# Patient Record
Sex: Male | Born: 1970 | Race: White | Hispanic: No | Marital: Single | Smoking: Never smoker
Health system: Southern US, Community
[De-identification: ages and names within clinical notes are randomized; demographics above are authoritative.]

## PROBLEM LIST (undated history)

## (undated) DIAGNOSIS — E119 Type 2 diabetes mellitus without complications: Secondary | ICD-10-CM

## (undated) HISTORY — PX: BACK SURGERY: SHX140

## (undated) HISTORY — PX: ROTATOR CUFF REPAIR: SHX139

---

## 2018-03-17 ENCOUNTER — Emergency Department (HOSPITAL_COMMUNITY): Payer: Commercial Managed Care - PPO

## 2018-03-17 ENCOUNTER — Other Ambulatory Visit: Payer: Self-pay

## 2018-03-17 ENCOUNTER — Encounter (HOSPITAL_COMMUNITY): Payer: Self-pay | Admitting: Emergency Medicine

## 2018-03-17 ENCOUNTER — Emergency Department (HOSPITAL_COMMUNITY)
Admission: EM | Admit: 2018-03-17 | Discharge: 2018-03-17 | Disposition: A | Discharge status: Home / Self Care | Payer: Commercial Managed Care - PPO | Attending: Emergency Medicine | Admitting: Emergency Medicine

## 2018-03-17 DIAGNOSIS — R109 Unspecified abdominal pain: Secondary | ICD-10-CM | POA: Insufficient documentation

## 2018-03-17 DIAGNOSIS — E119 Type 2 diabetes mellitus without complications: Secondary | ICD-10-CM | POA: Insufficient documentation

## 2018-03-17 DIAGNOSIS — R1110 Vomiting, unspecified: Secondary | ICD-10-CM

## 2018-03-17 DIAGNOSIS — K5900 Constipation, unspecified: Secondary | ICD-10-CM | POA: Diagnosis present

## 2018-03-17 HISTORY — DX: Type 2 diabetes mellitus without complications: E11.9

## 2018-03-17 LAB — COMPREHENSIVE METABOLIC PANEL
ALT: 25 U/L (ref 0–44)
ANION GAP: 25 — AB (ref 5–15)
AST: 20 U/L (ref 15–41)
Albumin: 5 g/dL (ref 3.5–5.0)
Alkaline Phosphatase: 86 U/L (ref 38–126)
BUN: 32 mg/dL — ABNORMAL HIGH (ref 6–20)
CO2: 14 mmol/L — ABNORMAL LOW (ref 22–32)
Calcium: 10 mg/dL (ref 8.9–10.3)
Chloride: 95 mmol/L — ABNORMAL LOW (ref 98–111)
Creatinine, Ser: 1.59 mg/dL — ABNORMAL HIGH (ref 0.61–1.24)
GFR calc Af Amer: 59 mL/min — ABNORMAL LOW (ref >60)
GFR calc non Af Amer: 51 mL/min — ABNORMAL LOW (ref >60)
Glucose, Bld: 244 mg/dL — ABNORMAL HIGH (ref 70–99)
Potassium: 5.1 mmol/L (ref 3.5–5.1)
Sodium: 134 mmol/L — ABNORMAL LOW (ref 135–145)
Total Bilirubin: 3.1 mg/dL — ABNORMAL HIGH (ref 0.3–1.2)
Total Protein: 8.7 g/dL — ABNORMAL HIGH (ref 6.5–8.1)

## 2018-03-17 LAB — CBC WITH DIFFERENTIAL/PLATELET
Abs Immature Granulocytes: 0.12 10*3/uL — ABNORMAL HIGH (ref 0.00–0.07)
Basophils Absolute: 0.1 10*3/uL (ref 0.0–0.1)
Basophils Relative: 1 %
EOS ABS: 0 10*3/uL (ref 0.0–0.5)
EOS PCT: 0 %
HCT: 55.7 % — ABNORMAL HIGH (ref 39.0–52.0)
Hemoglobin: 18.4 g/dL — ABNORMAL HIGH (ref 13.0–17.0)
Immature Granulocytes: 1 %
LYMPHS PCT: 10 %
Lymphs Abs: 1.8 10*3/uL (ref 0.7–4.0)
MCH: 28.4 pg (ref 26.0–34.0)
MCHC: 33 g/dL (ref 30.0–36.0)
MCV: 85.8 fL (ref 80.0–100.0)
Monocytes Absolute: 0.5 10*3/uL (ref 0.1–1.0)
Monocytes Relative: 3 %
Neutro Abs: 15 10*3/uL — ABNORMAL HIGH (ref 1.7–7.7)
Neutrophils Relative %: 85 %
Platelets: 321 10*3/uL (ref 150–400)
RBC: 6.49 MIL/uL — ABNORMAL HIGH (ref 4.22–5.81)
RDW: 12.2 % (ref 11.5–15.5)
WBC: 17.6 10*3/uL — ABNORMAL HIGH (ref 4.0–10.5)
nRBC: 0.1 % (ref 0.0–0.2)

## 2018-03-17 LAB — URINALYSIS, ROUTINE W REFLEX MICROSCOPIC
Bacteria, UA: NONE SEEN
Bilirubin Urine: NEGATIVE
Glucose, UA: >=500 mg/dL — AB
Hgb urine dipstick: NEGATIVE
Ketones, ur: 80 mg/dL — AB
Leukocytes,Ua: NEGATIVE
Nitrite: NEGATIVE
Protein, ur: NEGATIVE mg/dL
SPECIFIC GRAVITY, URINE: 1.028 (ref 1.005–1.030)
pH: 5 (ref 5.0–8.0)

## 2018-03-17 MED ORDER — ONDANSETRON 4 MG PO TBDP: ORAL_TABLET | ORAL | 0 refills | Status: AC

## 2018-03-17 MED ORDER — SODIUM CHLORIDE 0.9 % IV BOLUS
1000.0000 mL | Freq: Once | INTRAVENOUS | Status: AC
  Administered 2018-03-17: 1000 mL via INTRAVENOUS

## 2018-03-17 MED ORDER — IOHEXOL 300 MG/ML  SOLN
100.0000 mL | Freq: Once | INTRAMUSCULAR | Status: AC | PRN
  Administered 2018-03-17: 100 mL via INTRAVENOUS

## 2018-03-17 NOTE — ED Provider Notes (Signed)
[facility] EMERGENCY DEPARTMENT Provider Note   CSN: [id number] Arrival date & time: 03/17/18  1552    History   Chief Complaint Chief Complaint  Patient presents with   Constipation    HPI Tyrone Parker is a 48 y.o. male.     Patient has had vomiting yesterday today and feels constipated  The history is provided by the patient. No language interpreter was used.  Constipation  Severity:  Moderate Timing:  Constant Progression:  Worsening Chronicity:  Recurrent Context: dehydration   Stool description:  None produced Relieved by:  Nothing Worsened by:  Nothing Ineffective treatments:  None tried Associated symptoms: abdominal pain   Associated symptoms: no back pain and no diarrhea   Risk factors: no change in medication     Past Medical History:  Diagnosis Date   Diabetes mellitus without complication (HCC)     There are no active problems to display for this patient.   Past Surgical History:  Procedure Laterality Date   BACK SURGERY     ROTATOR CUFF REPAIR Left         Home Medications    Prior to Admission medications   Medication Sig Start Date End Date Taking? Authorizing Provider  ondansetron (ZOFRAN ODT) 4 MG disintegrating tablet 4mg  ODT q4 hours prn nausea/vomit 03/17/18   Bethann Berkshire, MD    Family History Family History  Problem Relation Age of Onset   Cancer Mother    Hypertension Mother    Diabetes Mother    Hypertension Father    Diabetes Father     Social History Social History   Tobacco Use   Smoking status: Never Smoker   Smokeless tobacco: Never Used  Substance Use Topics   Alcohol use: Never    Frequency: Never   Drug use: Never     Allergies   Patient has no known allergies.   Review of Systems Review of Systems  Constitutional: Negative for appetite change and fatigue.  HENT: Negative for congestion, ear discharge and sinus pressure.   Eyes: Negative for discharge.  Respiratory: Negative for  cough.   Cardiovascular: Negative for chest pain.  Gastrointestinal: Positive for abdominal pain and constipation. Negative for diarrhea.  Genitourinary: Negative for frequency and hematuria.  Musculoskeletal: Negative for back pain.  Skin: Negative for rash.  Neurological: Negative for seizures and headaches.  Psychiatric/Behavioral: Negative for hallucinations.     Physical Exam Updated Vital Signs BP (!) 138/94    Pulse (!) 116    Temp 97.7 F (36.5 C) (Oral)    Resp 16    Ht 5\' 11"  (1.803 m)    Wt 102.5 kg    SpO2 100%    BMI 31.52 kg/m   Physical Exam Vitals signs and nursing note reviewed.  Constitutional:      Appearance: He is well-developed.  HENT:     Head: Normocephalic.     Nose: Nose normal.  Eyes:     General: No scleral icterus.    Conjunctiva/sclera: Conjunctivae normal.  Neck:     Musculoskeletal: Neck supple.     Thyroid: No thyromegaly.  Cardiovascular:     Rate and Rhythm: Normal rate and regular rhythm.     Heart sounds: No murmur. No friction rub. No gallop.   Pulmonary:     Breath sounds: No stridor. No wheezing or rales.  Chest:     Chest wall: No tenderness.  Abdominal:     General: There is distension.     Tenderness:  There is no abdominal tenderness. There is no rebound.  Musculoskeletal: Normal range of motion.  Lymphadenopathy:     Cervical: No cervical adenopathy.  Skin:    Findings: No erythema or rash.  Neurological:     Mental Status: He is oriented to person, place, and time.     Motor: No abnormal muscle tone.     Coordination: Coordination normal.  Psychiatric:        Behavior: Behavior normal.      ED Treatments / Results  Labs (all labs ordered are listed, but only abnormal results are displayed) Labs Reviewed  CBC WITH DIFFERENTIAL/PLATELET - Abnormal; Notable for the following components:      Result Value   WBC 17.6 (*)    RBC 6.49 (*)    Hemoglobin 18.4 (*)    HCT 55.7 (*)    Neutro Abs 15.0 (*)    Abs  Immature Granulocytes 0.12 (*)    All other components within normal limits  COMPREHENSIVE METABOLIC PANEL - Abnormal; Notable for the following components:   Sodium 134 (*)    Chloride 95 (*)    CO2 14 (*)    Glucose, Bld 244 (*)    BUN 32 (*)    Creatinine, Ser 1.59 (*)    Total Protein 8.7 (*)    Total Bilirubin 3.1 (*)    GFR calc non Af Amer 51 (*)    GFR calc Af Amer 59 (*)    Anion gap 25 (*)    All other components within normal limits  URINALYSIS, ROUTINE W REFLEX MICROSCOPIC - Abnormal; Notable for the following components:   Glucose, UA >=500 (*)    Ketones, ur 80 (*)    All other components within normal limits    EKG EKG Interpretation  Date/Time:  Sunday March 17 2018 16:25:16 EDT Ventricular Rate:  132 PR Interval:  118 QRS Duration: 80 QT Interval:  292 QTC Calculation: 432 R Axis:   66 Text Interpretation:  Sinus tachycardia Otherwise normal ECG Confirmed by Bethann Berkshire [phone] on 03/17/2018 8:48:43 PM   Radiology Ct Abdomen Pelvis W Contrast  Result Date: 03/17/2018 CLINICAL DATA:  Patient had back surgery on 03/05/2018, patient taking oxycodone. Per patient has been unable to have a "decent BM" since surgery. Patient had enema, suppository, and stool softner yesterday with small results. Patient had another enema today with a little more results. Patient states seen PCP on Wednesday for high glucose levels and was placed on new medications. Patient states after PCP appointment he went to eat and was unable to get half the meal down before having indigestion. Patient states since he has had indigestion with nausea and vomiting. Patient states emesis is green liquid with brown mixed in EXAM: CT ABDOMEN AND PELVIS WITH CONTRAST TECHNIQUE: Multidetector CT imaging of the abdomen and pelvis was performed using the standard protocol following bolus administration of intravenous contrast. CONTRAST:  OMNIPAQUE IOHEXOL 300 MG/ML  SOLN COMPARISON:  Current  abdominal radiographs. FINDINGS: Lower chest: Clear lung bases.  Heart normal in size. Hepatobiliary: Decreased attenuation of the liver consistent with fatty infiltration. Liver size. No mass or focal lesion. Normal gallbladder. No bile duct dilation. Pancreas: Unremarkable. No pancreatic ductal dilatation or surrounding inflammatory changes. Spleen: Normal in size without focal abnormality. Adrenals/Urinary Tract: Adrenal glands are unremarkable. Kidneys are normal, without renal calculi, focal lesion, or hydronephrosis. Bladder is unremarkable. Stomach/Bowel: Stomach is unremarkable. Small bowel is normal in caliber. No wall thickening or inflammation. Mild  generalized increased colonic stool burden. Colon normal in caliber. No wall thickening. No inflammation. Normal appendix visualized. Vascular/Lymphatic: No significant vascular findings are present. No enlarged abdominal or pelvic lymph nodes. Reproductive: Unremarkable. Other: No abdominal wall hernia or abnormality. No abdominopelvic ascites. Musculoskeletal: Left hemi laminectomy at L5, with mild edema in the overlying soft tissues. No soft tissue mass or hematoma. No fracture or acute skeletal abnormality. No osteoblastic or osteolytic lesions. IMPRESSION: 1. No acute findings within the abdomen or pelvis. 2. Mild increase colonic stool burden. 3. Hepatic steatosis. 4. No evidence of a complication related to lumbar spine surgery. Electronically Signed   By: Amie Portland M.D.   On: 03/17/2018 20:29   Dg Abdomen Acute W/chest  Result Date: 03/17/2018 CLINICAL DATA:  Constipation. EXAM: DG ABDOMEN ACUTE W/ 1V CHEST COMPARISON:  None. FINDINGS: There is no evidence of dilated bowel loops or free intraperitoneal air. No radiopaque calculi or other significant radiographic abnormality is seen. Heart size and mediastinal contours are within normal limits. Both lungs are clear. Moderate fecal loading in the ascending colon. IMPRESSION: Moderate fecal  loading in the ascending colon. No other abnormalities. Electronically Signed   By: Gerome Sam III M.D   On: 03/17/2018 17:26    Procedures Procedures (including critical care time)  Medications Ordered in ED Medications  sodium chloride 0.9 % bolus 1,000 mL (0 mLs Intravenous Stopped 03/17/18 2102)  iohexol (OMNIPAQUE) 300 MG/ML solution 100 mL (100 mLs Intravenous Contrast Given 03/17/18 2009)     Initial Impression / Assessment and Plan / ED Course  I have reviewed the triage vital signs and the nursing notes.  Pertinent labs & imaging results that were available during my care of the patient were reviewed by me and considered in my medical decision making (see chart for details).        Labs unremarkable except for elevated white count.  CT scan shows minimal constipation.  Patient will be given some Zofran and told to stay hydrated and follow-up with his PCP  Final Clinical Impressions(s) / ED Diagnoses   Final diagnoses:  Acute vomiting    ED Discharge Orders         Ordered    ondansetron (ZOFRAN ODT) 4 MG disintegrating tablet     03/17/18 2100           Bethann Berkshire, MD 03/17/18 2105

## 2018-03-17 NOTE — ED Triage Notes (Signed)
Patient had back surgery on 03/05/2018, patient taking oxycodone. Per patient has been unable to have a "decent BM" since surgery. Patient had enema, suppository, and stool softner yesterday with small results. Patient had another enema today with a little more results. Patient states seen PCP on Wednesday for high glucose levels and was placed on new medications. Patient states after PCP appointment he went to eat and was unable to get half the meal down before having indigestion. Patient states since he has had indigestion with nausea and vomiting. Patient states emesis is green liquid with brown mixed in .

## 2018-03-17 NOTE — Discharge Instructions (Addendum)
Follow-up with your doctor this week for recheck.  Drink plenty of fluids  

## 2019-05-26 IMAGING — CT CT ABDOMEN AND PELVIS WITH CONTRAST
2 of 5 series · 16 of 46 positions shown, 18 images · IV contrast (Isovue)
Comparison: Current abdominal radiographs.

CLINICAL DATA: Patient had back surgery on 03/05/2018, patient taking
oxycodone. Per patient has been unable to have a "decent BM" since
surgery. Patient had enema, suppository, and stool softner yesterday
with small results. Patient had another enema today with a little
more results. Patient states seen PCP on [REDACTED] for high glucose
levels and was placed on new medications. Patient states after PCP
appointment he went to eat and was unable to get half the meal down
before having indigestion. Patient states since he has had
indigestion with nausea and vomiting. Patient states emesis is green
liquid with brown mixed in

EXAM:
CT ABDOMEN AND PELVIS WITH CONTRAST
TECHNIQUE: Multidetector CT imaging of the abdomen and pelvis was performed
using the standard protocol following bolus administration of
intravenous contrast.
CONTRAST:  100mL OMNIPAQUE IOHEXOL 300 MG/ML  SOLN

[Series 2: axial st · axial · 0.80mm/px · z∈[-380,+85]mm · 13 of 107 slices shown, 15 images]
[im 7/107  soft-tissue]
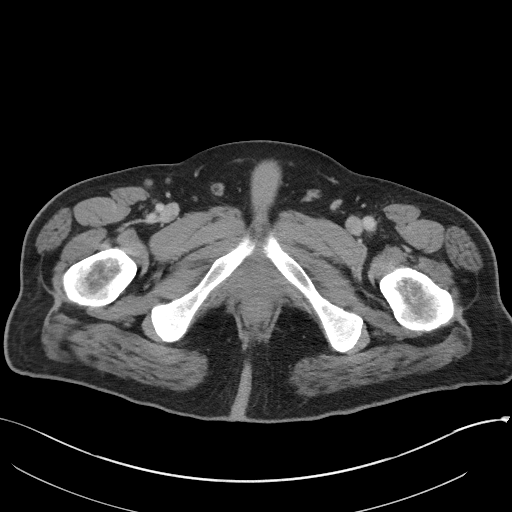
[im 7/107  bone]
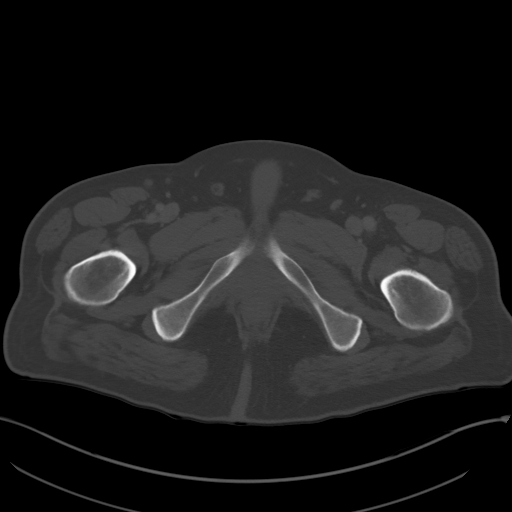
[im 13/107  soft-tissue]
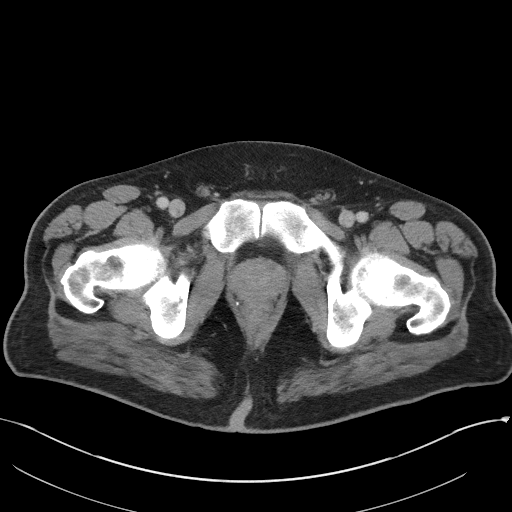
[im 25/107  soft-tissue]
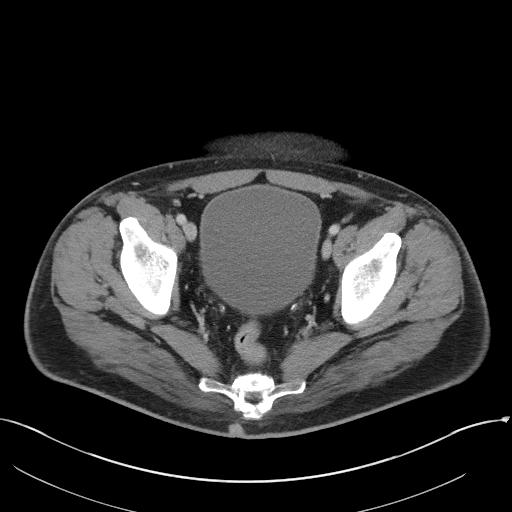
[im 32/107  soft-tissue]
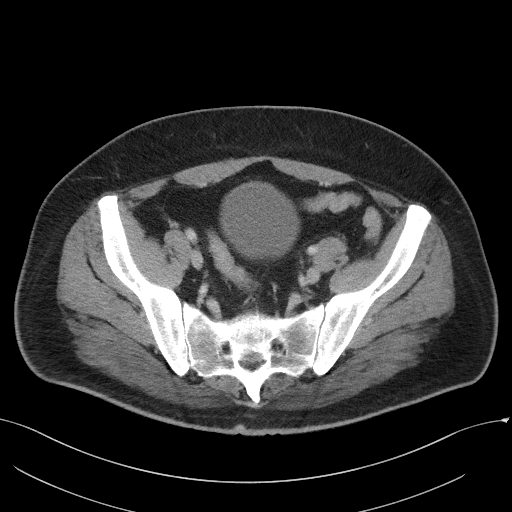
[im 38/107  soft-tissue]
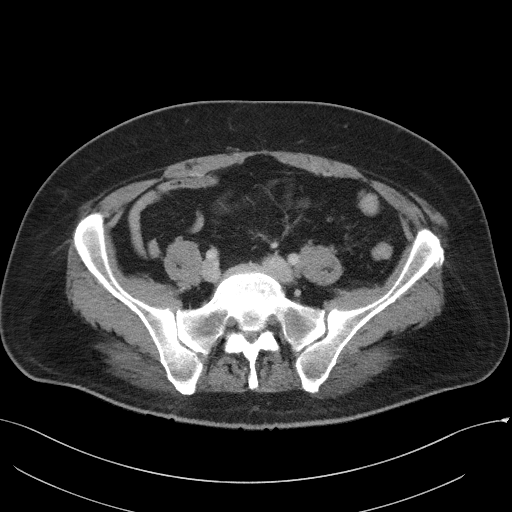
[im 44/107  soft-tissue]
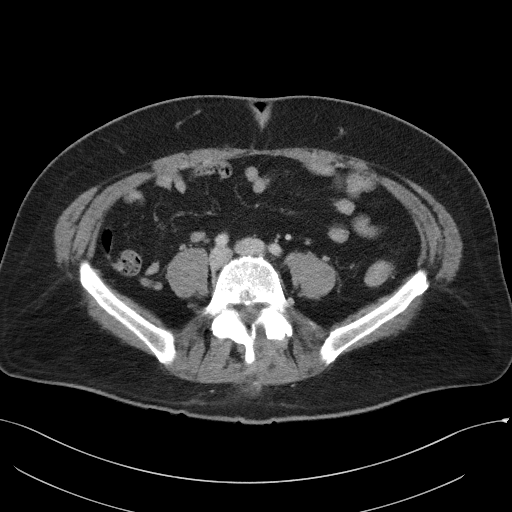
[im 57/107  soft-tissue]
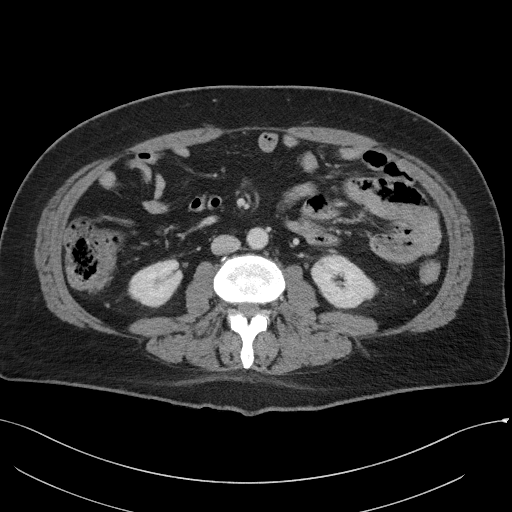
[im 63/107  soft-tissue]
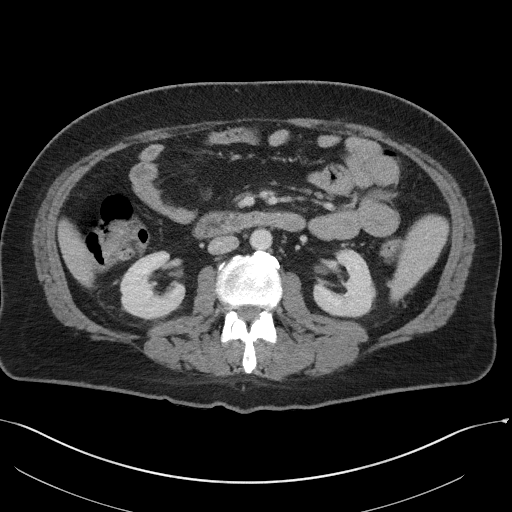
[im 69/107  soft-tissue]
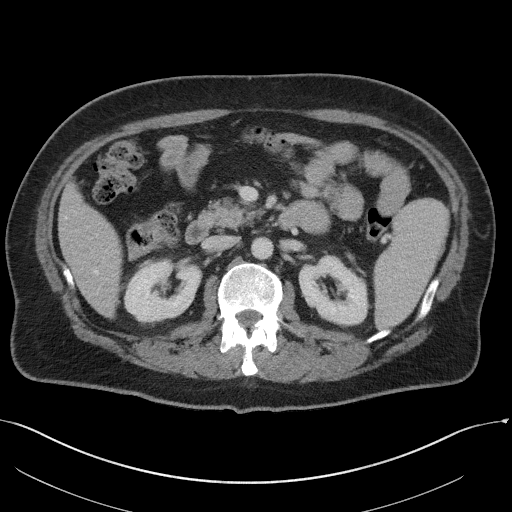
[im 69/107  bone]
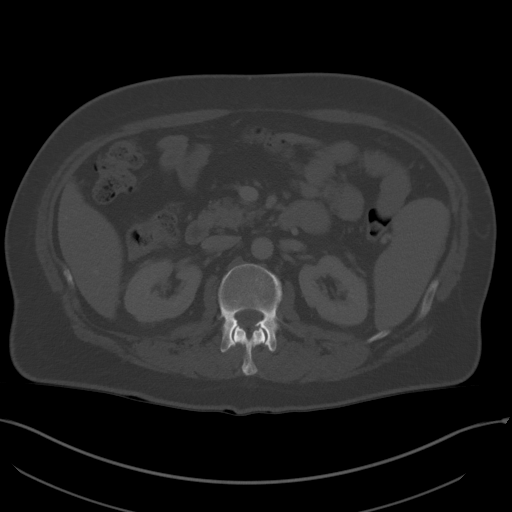
[im 75/107  soft-tissue]
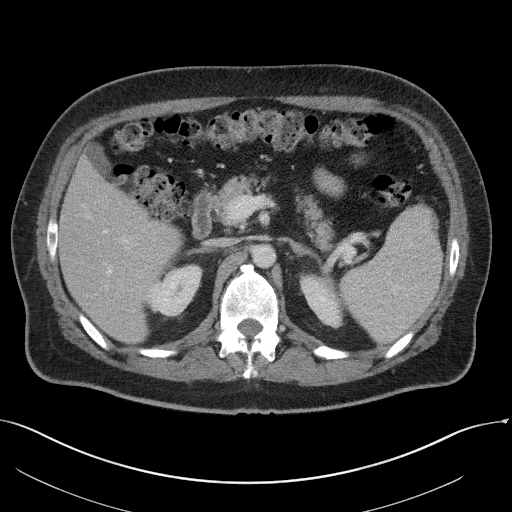
[im 82/107  soft-tissue]
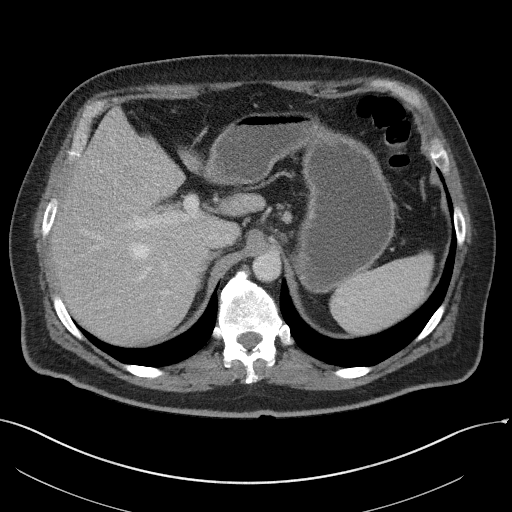
[im 94/107  soft-tissue]
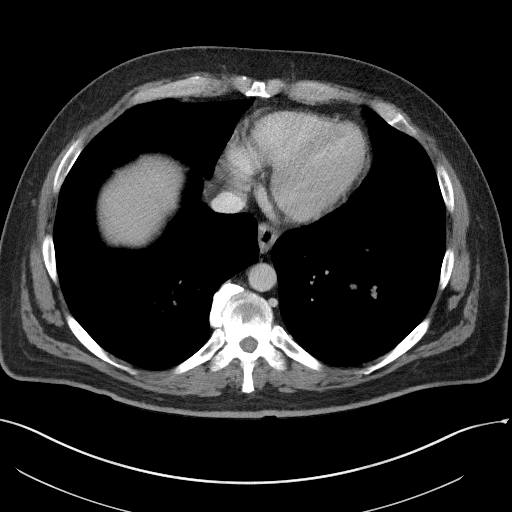
[im 100/107  soft-tissue]
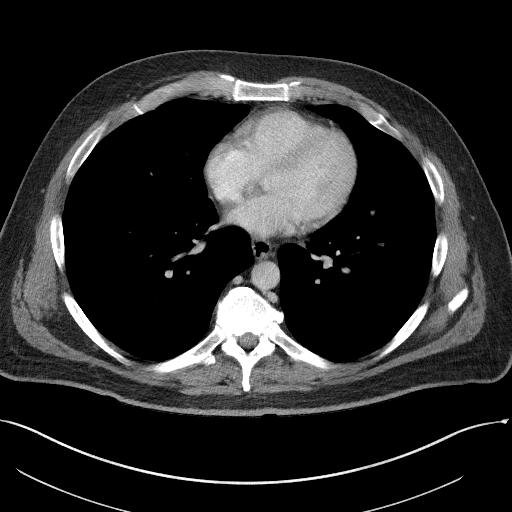

[Series 6: coronal st · coronal · 0.93mm/px · 3 of 117 slices shown]
[im 39/117  soft-tissue]
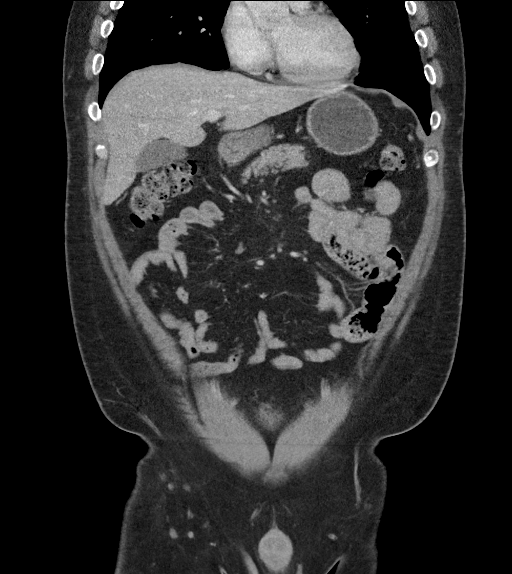
[im 52/117  soft-tissue]
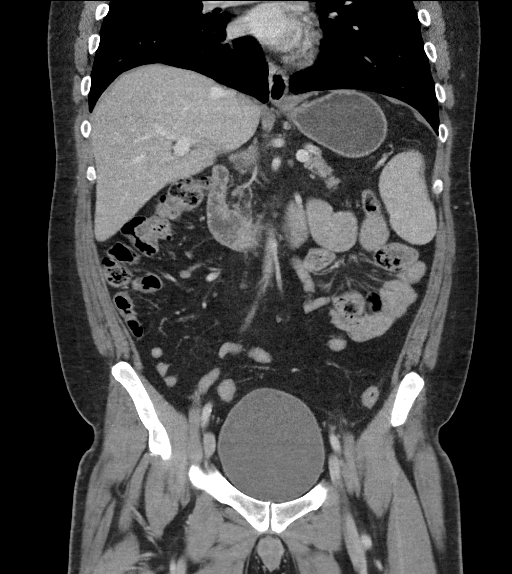
[im 65/117  soft-tissue]
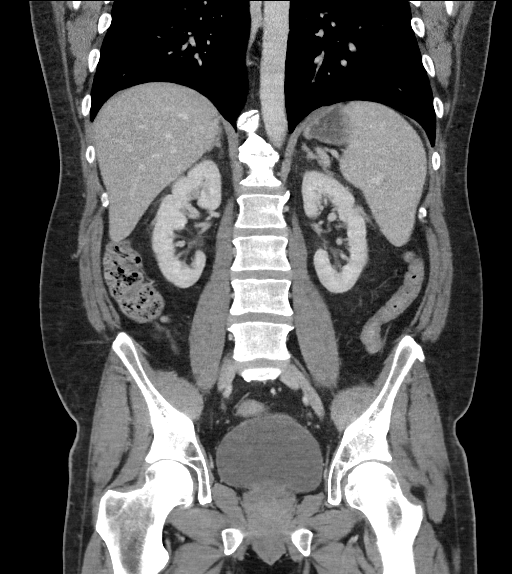

[16 of 46 positions shown; findings below may reference images not displayed]

FINDINGS: Lower chest: Clear lung bases.  Heart normal in size.

Hepatobiliary: Decreased attenuation of the liver consistent with
fatty infiltration. Liver size. No mass or focal lesion. Normal
gallbladder. No bile duct dilation.

Pancreas: Unremarkable. No pancreatic ductal dilatation or
surrounding inflammatory changes.

Spleen: Normal in size without focal abnormality.

Adrenals/Urinary Tract: Adrenal glands are unremarkable. Kidneys are
normal, without renal calculi, focal lesion, or hydronephrosis.
Bladder is unremarkable.

Stomach/Bowel: Stomach is unremarkable. Small bowel is normal in
caliber. No wall thickening or inflammation.

Mild generalized increased colonic stool burden. Colon normal in
caliber. No wall thickening. No inflammation.

Normal appendix visualized.

Vascular/Lymphatic: No significant vascular findings are present. No
enlarged abdominal or pelvic lymph nodes.

Reproductive: Unremarkable.

Other: No abdominal wall hernia or abnormality. No abdominopelvic
ascites.

Musculoskeletal: Left hemi laminectomy at L5, with mild edema in the
overlying soft tissues. No soft tissue mass or hematoma. No fracture
or acute skeletal abnormality. No osteoblastic or osteolytic
lesions.
IMPRESSION: 1. No acute findings within the abdomen or pelvis.
2. Mild increase colonic stool burden.
3. Hepatic steatosis.
4. No evidence of a complication related to lumbar spine surgery.
